# Patient Record
Sex: Male | Born: 1960 | Race: White | Hispanic: No | Marital: Married | Smoking: Current every day smoker
Health system: Southern US, Community
[De-identification: ages and names within clinical notes are randomized; demographics above are authoritative.]

## PROBLEM LIST (undated history)

## (undated) DIAGNOSIS — M1990 Unspecified osteoarthritis, unspecified site: Secondary | ICD-10-CM

## (undated) DIAGNOSIS — E7800 Pure hypercholesterolemia, unspecified: Secondary | ICD-10-CM

## (undated) DIAGNOSIS — M109 Gout, unspecified: Secondary | ICD-10-CM

## (undated) HISTORY — PX: ADENOIDECTOMY: SUR15

## (undated) HISTORY — PX: FOOT FRACTURE SURGERY: SHX645

## (undated) HISTORY — PX: WRIST FRACTURE SURGERY: SHX121

## (undated) HISTORY — PX: TONSILLECTOMY: SUR1361

## (undated) HISTORY — PX: KNEE CARTILAGE SURGERY: SHX688

---

## 2005-08-07 ENCOUNTER — Emergency Department: Payer: Self-pay | Admitting: Emergency Medicine

## 2005-08-11 ENCOUNTER — Ambulatory Visit: Payer: Self-pay | Admitting: Orthopaedic Surgery

## 2021-08-17 ENCOUNTER — Encounter (HOSPITAL_COMMUNITY): Payer: Self-pay | Admitting: Emergency Medicine

## 2021-08-17 ENCOUNTER — Emergency Department (HOSPITAL_COMMUNITY)
Admission: EM | Admit: 2021-08-17 | Discharge: 2021-08-17 | Disposition: A | Discharge status: Home / Self Care | Payer: BC Managed Care – PPO | Attending: Emergency Medicine | Admitting: Emergency Medicine

## 2021-08-17 ENCOUNTER — Emergency Department (HOSPITAL_COMMUNITY): Payer: BC Managed Care – PPO

## 2021-08-17 ENCOUNTER — Other Ambulatory Visit: Payer: Self-pay

## 2021-08-17 DIAGNOSIS — W11XXXA Fall on and from ladder, initial encounter: Secondary | ICD-10-CM | POA: Insufficient documentation

## 2021-08-17 DIAGNOSIS — S82141A Displaced bicondylar fracture of right tibia, initial encounter for closed fracture: Secondary | ICD-10-CM | POA: Insufficient documentation

## 2021-08-17 DIAGNOSIS — S8991XA Unspecified injury of right lower leg, initial encounter: Secondary | ICD-10-CM | POA: Diagnosis present

## 2021-08-17 HISTORY — DX: Pure hypercholesterolemia, unspecified: E78.00

## 2021-08-17 HISTORY — DX: Gout, unspecified: M10.9

## 2021-08-17 HISTORY — DX: Unspecified osteoarthritis, unspecified site: M19.90

## 2021-08-17 MED ORDER — KETOROLAC TROMETHAMINE 15 MG/ML IJ SOLN
15.0000 mg | Freq: Once | INTRAMUSCULAR | Status: AC
  Administered 2021-08-17: 15 mg via INTRAMUSCULAR
  Filled 2021-08-17: qty 1

## 2021-08-17 MED ORDER — OXYCODONE-ACETAMINOPHEN 5-325 MG PO TABS: 1.0000 | ORAL_TABLET | Freq: Four times a day (QID) | ORAL | 0 refills | Status: AC | PRN

## 2021-08-17 MED ORDER — METHOCARBAMOL 500 MG PO TABS
500.0000 mg | ORAL_TABLET | Freq: Once | ORAL | Status: AC
  Administered 2021-08-17: 500 mg via ORAL
  Filled 2021-08-17: qty 1

## 2021-08-17 MED ORDER — MORPHINE SULFATE 15 MG PO TABS: 7.5000 mg | ORAL_TABLET | ORAL | 0 refills | Status: AC | PRN

## 2021-08-17 MED ORDER — METHOCARBAMOL 500 MG PO TABS: 500.0000 mg | ORAL_TABLET | Freq: Two times a day (BID) | ORAL | 0 refills | Status: AC

## 2021-08-17 MED ORDER — ONDANSETRON HCL 4 MG PO TABS: 4.0000 mg | ORAL_TABLET | Freq: Four times a day (QID) | ORAL | 0 refills | Status: AC | PRN

## 2021-08-17 MED ORDER — OXYCODONE-ACETAMINOPHEN 5-325 MG PO TABS
1.0000 | ORAL_TABLET | Freq: Once | ORAL | Status: AC
  Administered 2021-08-17: 1 via ORAL
  Filled 2021-08-17: qty 1

## 2021-08-17 MED ORDER — ONDANSETRON HCL 4 MG PO TABS: 4.0000 mg | ORAL_TABLET | Freq: Four times a day (QID) | ORAL | 0 refills | Status: AC

## 2021-08-17 MED ORDER — HYDROMORPHONE HCL 1 MG/ML IJ SOLN
1.0000 mg | Freq: Once | INTRAMUSCULAR | Status: AC
  Administered 2021-08-17: 1 mg via INTRAMUSCULAR
  Filled 2021-08-17: qty 1

## 2021-08-17 NOTE — ED Notes (Signed)
Patient returned from X-ray 

## 2021-08-17 NOTE — ED Triage Notes (Signed)
Patient brought in via EMS from home. Alert and oriented. Airway patent. Patient c/o bilateral knee pain and swelling. Patient 3 foot up on ladder yesterday and fell, landing on both feet. Pain in knees started shortly after fall and progressively getting worse. Rt knee worse with limited ROM. Patient took Aleve yesterday with no relief.

## 2021-08-17 NOTE — ED Notes (Signed)
EDP at [facility] discussing dx, plan, meds, d/c, tx, and f/u. Family at [facility] x2

## 2021-08-17 NOTE — ED Notes (Signed)
Pt poorly tolerated knee immobilizer(s) and crutches. EDPA at [facility].

## 2021-08-17 NOTE — ED Notes (Signed)
Patient transported to X-ray 

## 2021-08-17 NOTE — ED Notes (Signed)
Pt alert, NAD, calm, interactive, EDPA into room, at [facility].

## 2021-08-17 NOTE — ED Provider Notes (Signed)
[facility]ERGENCY DEPARTMENT Provider Note   CSN: [id number] Arrival date & time: 08/17/21  1008     History  Chief Complaint  Patient presents with   Knee Pain    Charles Perry is a 61 y.o. male.  Charles FoyFrank Becknell is a 61 y.o. male with a history of hyperlipidemia, arthritis, and prior left knee surgeries, who presents to the emergency department via EMS for evaluation of bilateral knee pain after a fall.  Patient reports that he was about 3 feet up on a ladder yesterday when he fell landing on both feet.  He reports shortly after this fall he started having progressively worsening pain in both of his knees with some swelling but he reports that the pain is much worse on right than left.  Reports he feels like the right leg is spasming intermittently causing his right knee to bend and it is extremely painful.  He has not been able to put weight on the knee since then.  Also complains of some pain at the left hip.  Did not hit his head, denies neck or back pain.  No pain in the chest or abdomen.  Took Aleve for his knee pain yesterday without relief.  Reports remote history of multiple surgeries to the left knee and he has had chronic issues with this knee ever since.   The history is provided by the patient, the spouse and a relative.  Knee Pain Associated symptoms: no back pain, no fever and no neck pain       Home Medications Prior to Admission medications   Medication Sig Start Date End Date Taking? Authorizing Provider  allopurinol (ZYLOPRIM) 100 MG tablet Take 100 mg by mouth daily. 07/06/21  Yes [provider]  atorvastatin (LIPITOR) 20 MG tablet Take 20 mg by mouth daily. 07/14/21  Yes [provider]  methocarbamol (ROBAXIN) 500 MG tablet Take 1 tablet (500 mg total) by mouth 2 (two) times daily. 08/17/21  Yes Dartha LodgeFord, Shiven Junious N, PA-C  morphine (MSIR) 15 MG tablet Take 0.5-1 tablets (7.5-15 mg total) by mouth every 4 (four) hours as needed for up to 3 days for severe  pain. 08/17/21 08/20/21 Yes Dartha LodgeFord, Andriel Omalley N, PA-C  ondansetron (ZOFRAN) 4 MG tablet Take 1 tablet (4 mg total) by mouth every 6 (six) hours. 08/17/21  Yes Dartha LodgeFord, Ammon Muscatello N, PA-C  ondansetron (ZOFRAN) 4 MG tablet Take 1 tablet (4 mg total) by mouth every 6 (six) hours as needed for nausea or vomiting. 08/17/21  Yes Dartha LodgeFord, Jerrol Helmers N, PA-C  oxyCODONE-acetaminophen (PERCOCET/ROXICET) 5-325 MG tablet Take 1-2 tablets by mouth every 6 (six) hours as needed for severe pain. 08/17/21  Yes Dartha LodgeFord, Latosha Gaylord N, PA-C      Allergies    Sulfa antibiotics    Review of Systems   Review of Systems  Constitutional:  Negative for chills and fever.  HENT: Negative.    Respiratory:  Negative for cough and shortness of breath.   Cardiovascular:  Negative for chest pain.  Gastrointestinal:  Negative for abdominal pain.  Genitourinary:  Negative for dysuria.  Musculoskeletal:  Positive for arthralgias and joint swelling. Negative for back pain and neck pain.  Neurological:  Negative for dizziness, weakness, light-headedness and numbness.  All other systems reviewed and are negative.  Physical Exam Updated Vital Signs BP (!) 127/96   Pulse 82   Temp 98.3 F (36.8 C) (Oral)   Resp 16   Ht 5\' 10"  (1.778 m)   Wt 83.9 kg  SpO2 96%   BMI 26.54 kg/m  Physical Exam Vitals and nursing note reviewed.  Constitutional:      General: He is not in acute distress.    Appearance: Normal appearance. He is well-developed. He is not diaphoretic.  HENT:     Head: Normocephalic and atraumatic.     Comments: No hematoma, step-off or deformity, no signs of head trauma Eyes:     General:        Right eye: No discharge.        Left eye: No discharge.     Pupils: Pupils are equal, round, and reactive to light.  Neck:     Comments: No midline C-spine tenderness Cardiovascular:     Rate and Rhythm: Normal rate and regular rhythm.     Pulses: Normal pulses.     Heart sounds: Normal heart sounds.  Pulmonary:     Effort: Pulmonary  effort is normal. No respiratory distress.     Breath sounds: Normal breath sounds. No wheezing or rales.     Comments: Respirations equal and unlabored, patient able to speak in full sentences, lungs clear to auscultation bilaterally  Chest:     Chest wall: No tenderness.  Abdominal:     General: Bowel sounds are normal. There is no distension.     Palpations: Abdomen is soft. There is no mass.     Tenderness: There is no abdominal tenderness. There is no guarding.     Comments: Abdomen soft, nondistended, nontender to palpation in all quadrants without guarding or peritoneal signs  Musculoskeletal:        General: Swelling, tenderness and deformity present.     Cervical back: Neck supple.     Comments: Noticed swelling present over both knees, left knee without significant tenderness but more pain with movement.  The right knee is significantly tender to palpation throughout and patient has severe pain with any movement and significantly decreased range of motion. No tenderness throughout the lower leg bilaterally, no tenderness at the foot or ankle.  There is some pain over the left posterior hip without rotation, shortening or deformity. All other joints supple and easily movable, all compartments soft  Skin:    General: Skin is warm and dry.     Capillary Refill: Capillary refill takes less than 2 seconds.  Neurological:     Mental Status: He is alert and oriented to person, place, and time.     Coordination: Coordination normal.     Comments: Speech is clear, able to follow commands CN III-XII intact Normal strength in upper and lower extremities bilaterally including dorsiflexion and plantar flexion, strong and equal grip strength Sensation normal to light and sharp touch Moves extremities without ataxia, coordination intact  Psychiatric:        Mood and Affect: Mood normal.        Behavior: Behavior normal.    ED Results / Procedures / Treatments   Labs (all labs ordered are  listed, but only abnormal results are displayed) Labs Reviewed - No data to display  EKG None  Radiology CT Knee Right Wo Contrast  Result Date: 08/17/2021 CLINICAL DATA:  Patient fell off step ladder today. Fracture of the proximal tibia. Nondisplaced and non comminuted proximal tibial fracture seen on radiograph. EXAM: CT OF THE RIGHT KNEE WITHOUT CONTRAST TECHNIQUE: Multidetector CT imaging of the right knee was performed according to the standard protocol. Multiplanar CT image reconstructions were also generated. RADIATION DOSE REDUCTION: This exam was performed according  to the departmental dose-optimization program which includes automated exposure control, adjustment of the mA and/or kV according to patient size and/or use of iterative reconstruction technique. COMPARISON:  Right knee radiographs 08/17/2021 FINDINGS: Bones/Joint/Cartilage There is an acute comminuted fracture involving the proximal lateral greater than medial aspects of the tibia. On sagittal images, an oblique nondisplaced fracture line extends from the posterosuperior medial tibial plateau through the posterosuperior lateral tibial plateau (sagittal series 6 images 38 through 104). There is a comminuted fracture of the lateral tibial plateau that contacts the superior articular surface in multiple regions (coronal series 5, images 74 through 114). There is up to approximately 5 mm depression of the posterolateral tibial plateau (coronal series 5, image 113) and up to approximately 4 mm cortical depression of the more focal aspect of the anteromedial aspect of the lateral tibial plateau (coronal series 5, image 75). A nondisplaced vertically oriented fracture line also extends through the a anterior medial proximal tibial metaphyseal cortex (coronal series 5, image 76). There also nondisplaced fracture lines extending through the medial and lateral tibial spines. No acute fracture is seen within the distal femur or proximal fibula.  Ligaments Suboptimally assessed by CT. Muscles and Tendons Normal size and density of the regional musculature. No gross large tendon tear is seen. Soft tissues Moderate to high-grade knee joint effusion with layering higher density blood products and nondependent fat (lipohemarthrosis). Mild-to-moderate predominantly anterior knee subcutaneous fat edema and swelling. Additional edema of the posterolateral knee soft tissues. IMPRESSION: 1. Acute, nondisplaced fracture of the proximal lateral greater than proximal medial aspects of the tibia. This extends to the superior articular surface of the lateral greater than medial tibial plateaus. There is up to 5 mm cortical depression of the posterior aspect of the lateral tibial plateau. More focal up to 4 mm cortical depression of a portion of the anteromedial aspect of the lateral tibial plateau. 2. Moderate to high-grade lipohemarthrosis. Electronically Signed   By: Neita Garnet M.D.   On: 08/17/2021 13:32   DG Knee Complete 4 Views Left  Result Date: 08/17/2021 CLINICAL DATA:  Patient fell proximally 3 steps from a ladder 1 day ago. Patient fell the right knee twist. Now unable to bend or bear weight. EXAM: LEFT KNEE - COMPLETE 4+ VIEW COMPARISON:  None Available. FINDINGS: No fracture.  No bone lesion. Moderate narrowing of the lateral compartment with mild medial compartment narrowing. Marginal osteophytes noted from all 3 compartments. Moderate joint effusion distends the suprapatellar joint capsule. Skeletal structures are demineralized. IMPRESSION: 1. No fracture or dislocation. 2. Moderate osteoarthritis, most severe involving the lateral compartment. 3. Moderate joint effusion. Electronically Signed   By: Amie Portland M.D.   On: 08/17/2021 12:00   DG Knee Complete 4 Views Right  Result Date: 08/17/2021 CLINICAL DATA:  Fall from step ladder 1 day ago. Twisting injury to the right knee. Unable to bend or bear weight. EXAM: RIGHT KNEE - COMPLETE 4+ VIEW  COMPARISON:  None Available. FINDINGS: Subtle fracture of the proximal tibia noted on the lateral and 1 oblique view, intersecting the tibial spine on the lateral view. No other evidence of a fracture. Knee joint normally spaced and aligned. Moderate joint effusion with evidence of a fat fluid level. IMPRESSION: 1. Subtle poorly defined fracture of the proximal tibia, nondisplaced and non comminuted. Associated knee joint effusion. Electronically Signed   By: Amie Portland M.D.   On: 08/17/2021 12:03   DG Hip Unilat W or Wo Pelvis 2-3 Views Left  Result Date: 08/17/2021 CLINICAL DATA:  Larey Seat from a stepladder, proximally 3 feet, 1 day ago. Left hip pain. EXAM: DG HIP (WITH OR WITHOUT PELVIS) 2-3V LEFT COMPARISON:  None Available. FINDINGS: There is no evidence of hip fracture or dislocation. There is no evidence of arthropathy or other focal bone abnormality. IMPRESSION: Negative. Electronically Signed   By: Amie Portland M.D.   On: 08/17/2021 12:04    Procedures Procedures    Medications Ordered in ED Medications  oxyCODONE-acetaminophen (PERCOCET/ROXICET) 5-325 MG per tablet 1 tablet (1 tablet Oral Given 08/17/21 1104)  HYDROmorphone (DILAUDID) injection 1 mg (1 mg Intramuscular Given 08/17/21 1243)  ketorolac (TORADOL) 15 MG/ML injection 15 mg (15 mg Intramuscular Given 08/17/21 1513)  methocarbamol (ROBAXIN) tablet 500 mg (500 mg Oral Given 08/17/21 1513)  oxyCODONE-acetaminophen (PERCOCET/ROXICET) 5-325 MG per tablet 1 tablet (1 tablet Oral Given 08/17/21 1513)    ED Course/ Medical Decision Making/ A&P                           Medical Decision Making Amount and/or Complexity of Data Reviewed Radiology: ordered.  Risk Prescription drug management.   This patient presents to the ED for concern of fall, bilateral knee pain, this involves an extensive number of treatment options, and is a complaint that carries with it a high risk of complications and morbidity.  The differential diagnosis  includes fracture, internal derangement, arthritis, traumatic effusion     Additional history obtained:  Additional history obtained from wife and son at bedside External records from outside source obtained and reviewed, unable to review records from prior knee surgeries   Imaging Studies ordered:  I ordered imaging studies including Bilateral knee x-rays, left hip x-ray, CT right knee  I independently visualized and interpreted imaging which showed left hip and knee without fracture, left knee with moderate osteoarthritis, right knee with poorly define fracture of proximal tibia, proceeded with CT for better characterization, which show a tibial plateau fracture, primarily involving the lateral plateau with some depression but no displacement. I agree with the radiologist interpretation    Consultations Obtained:  I requested consultation with the orthopedist, Dr. Linna Caprice,  and discussed lab and imaging findings as well as pertinent plan - they recommend: Knee immobilizer and crutches and for patient to remain nonweightbearing on the right knee, outpatient follow-up with Dr. Carola Frost or Dr. Jena Gauss with orthopedic trauma within the next week. I have sent a chart message to Dr. Carola Frost and Dr. Jena Gauss regarding patient   Problem List / ED Course / Critical interventions / Medication management  Pt with knee injuries after 3 ft fall from ladder. Hx of chronic left knee issues, no acute fx  in the left knee or hip, but right knee with tibial plateau fracture. No displacement but some depression present. Discussed with ortho, recommend immobilization and non-weightbearing, outpatient follow up I ordered medication including Oral and IM narcotics for pain management, as well as Toradol and robaxin for pain and spasm  Reevaluation of the patient after these medicines showed that the patient improved I have reviewed the patients home medicines and have made adjustments as needed Patient placed  in knee immobilizer and crutches, pt had difficulty with crutches, but family will be able to help pt, and he has been given prescription for wheelchair and bedside commode.    Test / Admission - Considered:  Admission was considered, but given imaging and discussion with orthopedics patient recommended for discharge home  with outpatient follow-up with orthopedics.  Patient was provided prescriptions for wheelchair and bedside commode, and given crutches and knee immobilizer and instructed to remain nonweightbearing on the right knee. Discussed these recommendations at length with patient and family at bedside who are in agreement.         Final Clinical Impression(s) / ED Diagnoses Final diagnoses:  Closed fracture of right tibial plateau, initial encounter    Rx / DC Orders ED Discharge Orders          Ordered    Wheelchair        08/17/21 1523    DME Bedside commode        08/17/21 1524    morphine (MSIR) 15 MG tablet  Every 4 hours PRN        08/17/21 1601    ondansetron (ZOFRAN) 4 MG tablet  Every 6 hours        08/17/21 1601    methocarbamol (ROBAXIN) 500 MG tablet  2 times daily        08/17/21 1601    oxyCODONE-acetaminophen (PERCOCET/ROXICET) 5-325 MG tablet  Every 6 hours PRN        08/17/21 1602    ondansetron (ZOFRAN) 4 MG tablet  Every 6 hours PRN        08/17/21 1602              Dartha Lodge, PA-C 08/18/21 3212    Eber Hong, MD 08/25/21 [phone]

## 2021-08-17 NOTE — ED Notes (Signed)
To CT

## 2021-08-17 NOTE — Discharge Instructions (Addendum)
You have a tibial plateau fracture on the right knee. You will need to remain in knee immobilizer, do not put weight on your right leg.   You have been given prescriptions for a wheelchair and bedside commode this can be picked up at the medical supply store tomorrow.  You can use crutches to help stand and pivot.  You will need to call tomorrow morning to schedule close follow-up appointment with Charles Perry. Charles Perry, who are orthopedic trauma specialist.  To help manage pain you can use the oxycodone provided to you at discharge today 1 to 2 tablets as needed every 6 hours for pain and can use Zofran as needed for nausea.  I have sent in a prescription to your pharmacy that you will be able to pick up tomorrow morning for morphine start with half a tablet every 4 hours if still having severe pain you can increase to 1 tablet every 4 hours for pain control.  You can use prescribed Zofran as needed for nausea or vomiting.  If the morphine tends to make you nauseated please take at least 15 to 30 minutes prior to dose of morphine.  You can use prescribed Robaxin as needed for muscle spasm.

## 2021-08-17 NOTE — ED Notes (Signed)
EDPA into see, at BS.   

## 2021-09-25 ENCOUNTER — Other Ambulatory Visit: Payer: Self-pay | Admitting: Orthopedic Surgery

## 2021-09-25 DIAGNOSIS — S83231A Complex tear of medial meniscus, current injury, right knee, initial encounter: Secondary | ICD-10-CM

## 2021-09-28 ENCOUNTER — Ambulatory Visit
Admission: RE | Admit: 2021-09-28 | Discharge: 2021-09-28 | Disposition: A | Discharge status: Home / Self Care | Payer: BC Managed Care – PPO | Source: Ambulatory Visit | Attending: Orthopedic Surgery | Admitting: Orthopedic Surgery

## 2021-09-28 DIAGNOSIS — S83231A Complex tear of medial meniscus, current injury, right knee, initial encounter: Secondary | ICD-10-CM

## 2021-10-03 ENCOUNTER — Other Ambulatory Visit: Payer: BC Managed Care – PPO

## 2023-05-19 IMAGING — DX DG KNEE COMPLETE 4+V*L*
4 series · 4 of 4 positions shown · non-contrast
Comparison: None Available.

CLINICAL DATA: Patient fell proximally 3 steps from a ladder 1 day
ago. Patient fell the right knee twist. Now unable to bend or bear
weight.

EXAM:
LEFT KNEE - COMPLETE 4+ VIEW

[knee ap]
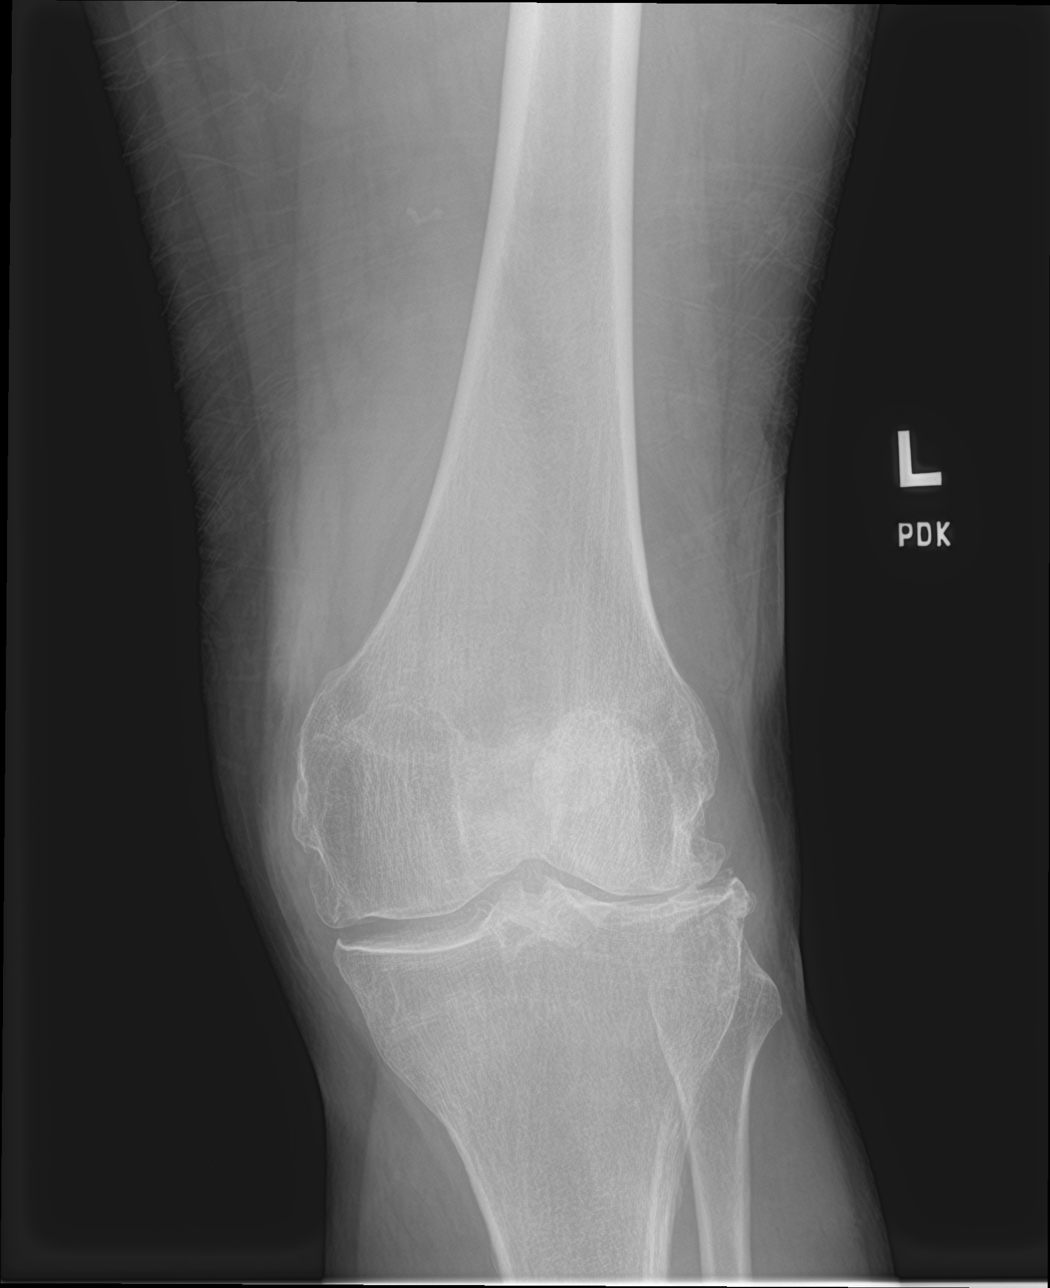

[knee obl (1 of 2)]
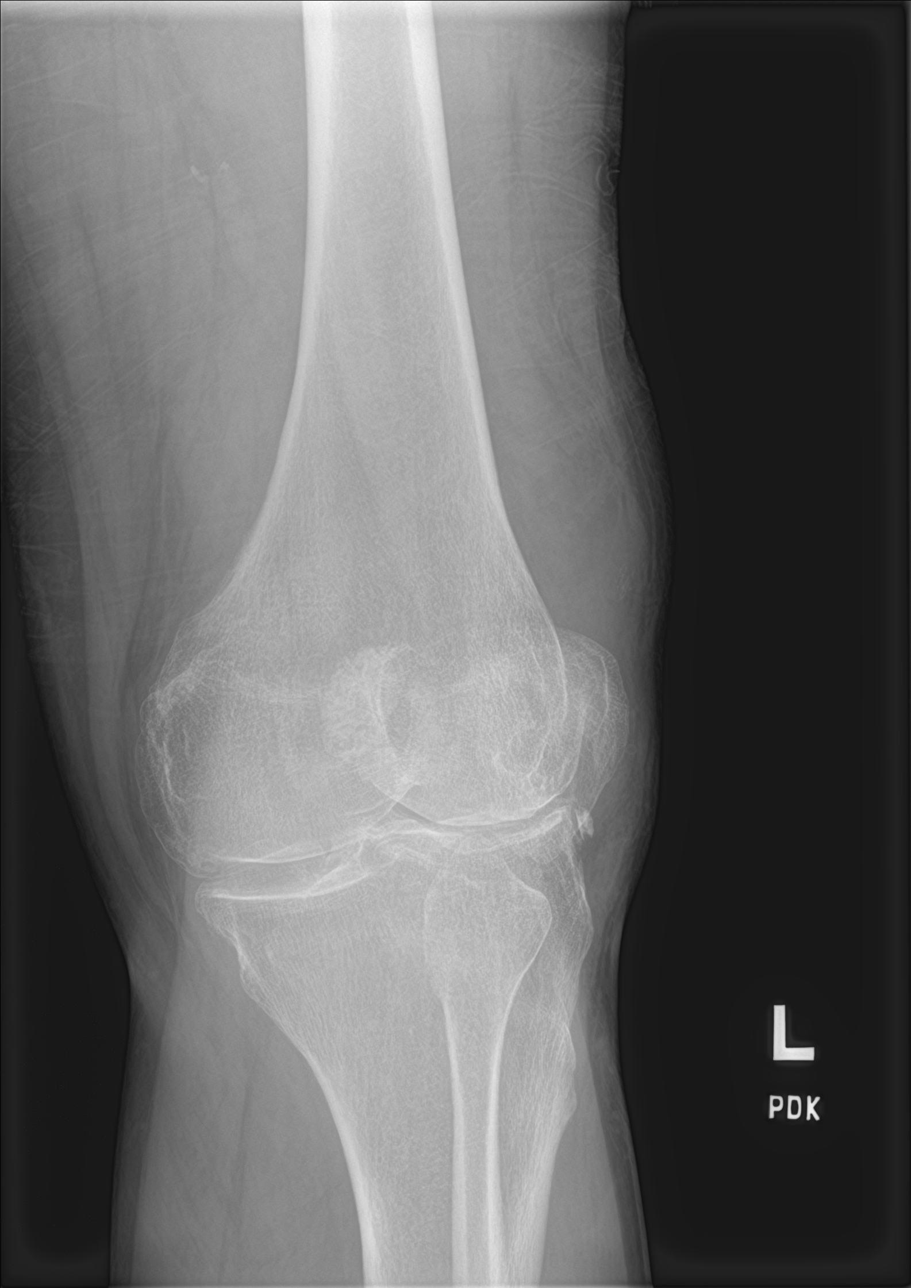

[knee obl (2 of 2)]
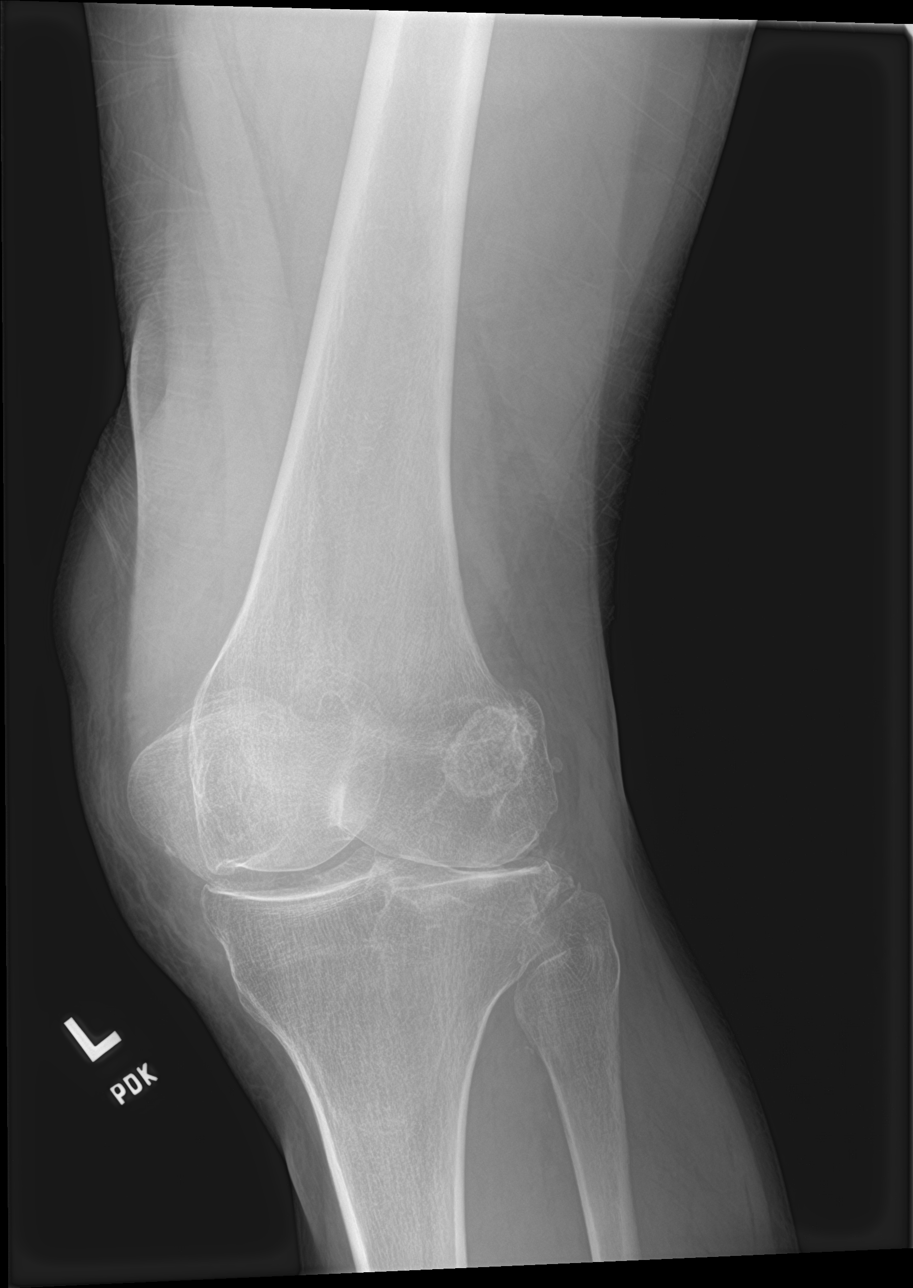

[knee lat]
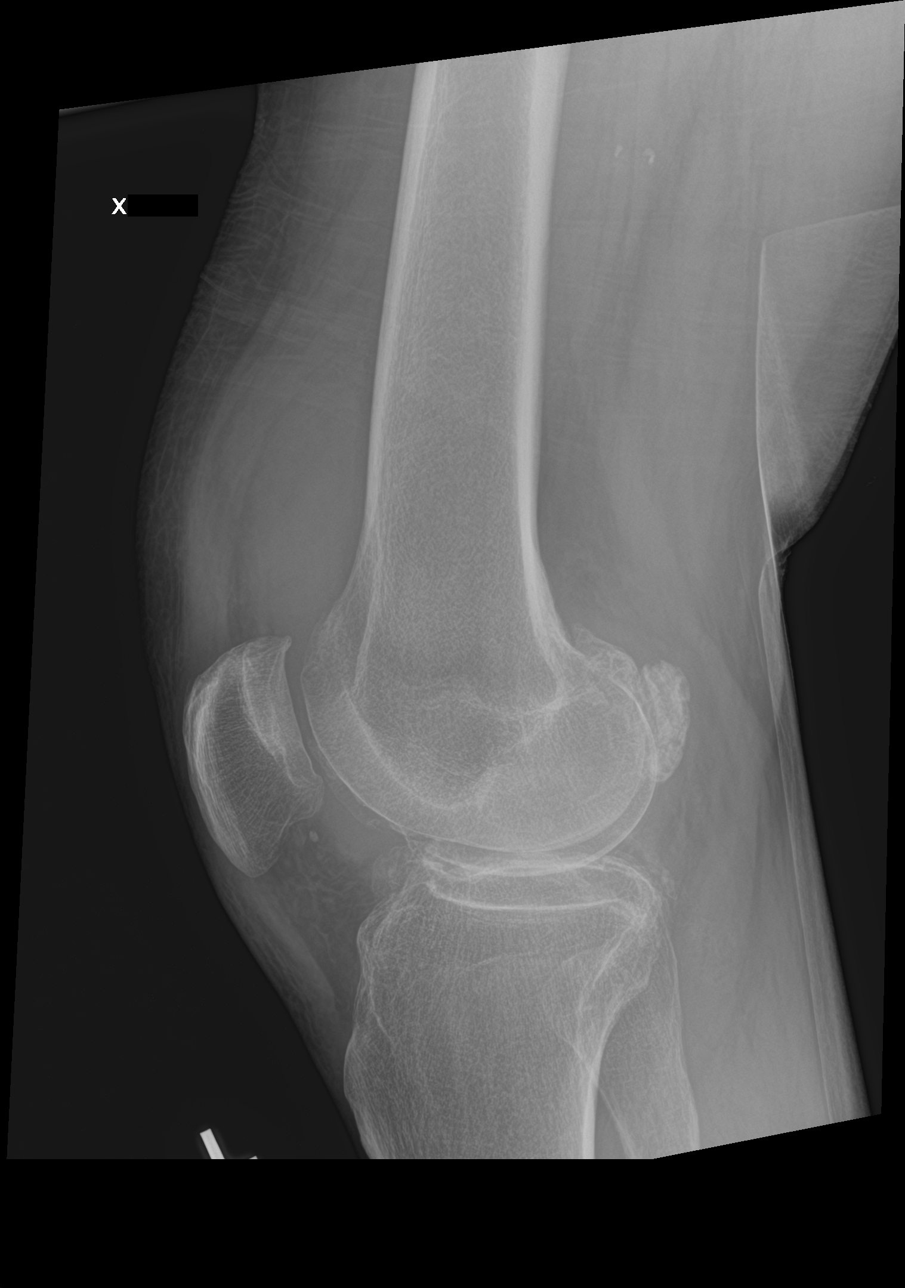

[4 of 4 positions shown; findings below may reference images not displayed]

FINDINGS: No fracture.  No bone lesion.

Moderate narrowing of the lateral compartment with mild medial
compartment narrowing. Marginal osteophytes noted from all 3
compartments.

Moderate joint effusion distends the suprapatellar joint capsule.

Skeletal structures are demineralized.
IMPRESSION: 1. No fracture or dislocation.
2. Moderate osteoarthritis, most severe involving the lateral
compartment.
3. Moderate joint effusion.

## 2023-05-19 IMAGING — CT CT KNEE*R* W/O CM
3 of 7 series · 9 of 33 positions shown, 10 images · non-contrast
Comparison: Right knee radiographs 08/17/2021

CLINICAL DATA: Patient fell off step ladder today. Fracture of the
proximal tibia. Nondisplaced and non comminuted proximal tibial
fracture seen on radiograph.

EXAM:
CT OF THE RIGHT KNEE WITHOUT CONTRAST
TECHNIQUE: Multidetector CT imaging of the right knee was performed according
to the standard protocol. Multiplanar CT image reconstructions were
also generated.
RADIATION DOSE REDUCTION: This exam was performed according to the
departmental dose-optimization program which includes automated
exposure control, adjustment of the mA and/or kV according to
patient size and/or use of iterative reconstruction technique.

[Series 4: axial bone · axial · 0.36mm/px · z∈[+379,+594]mm · 3 of 216 slices shown, 4 images]
[im 1/216  soft-tissue]
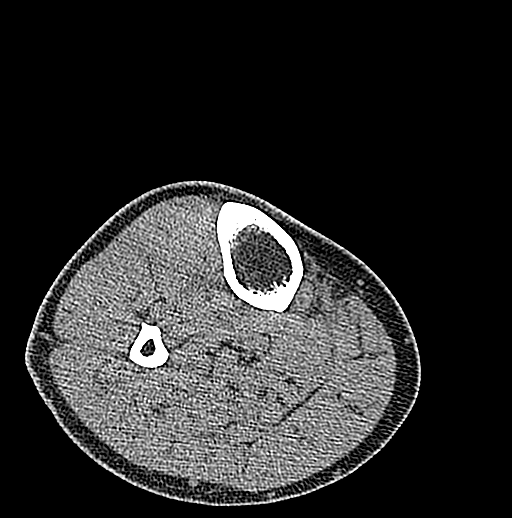
[im 1/216  bone]
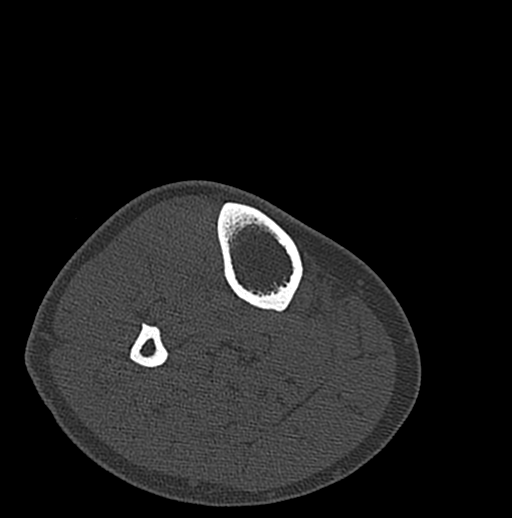
[im 108/216  bone]
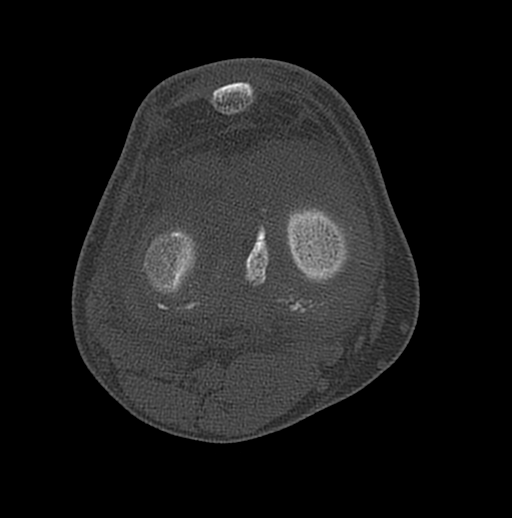
[im 216/216  bone]
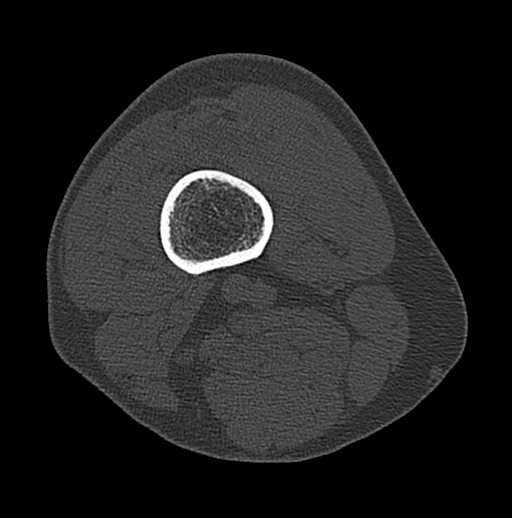

[Series 5: cor bone · coronal · 0.32mm/px · 1 of 193 slices shown]
[im 97/193  bone]
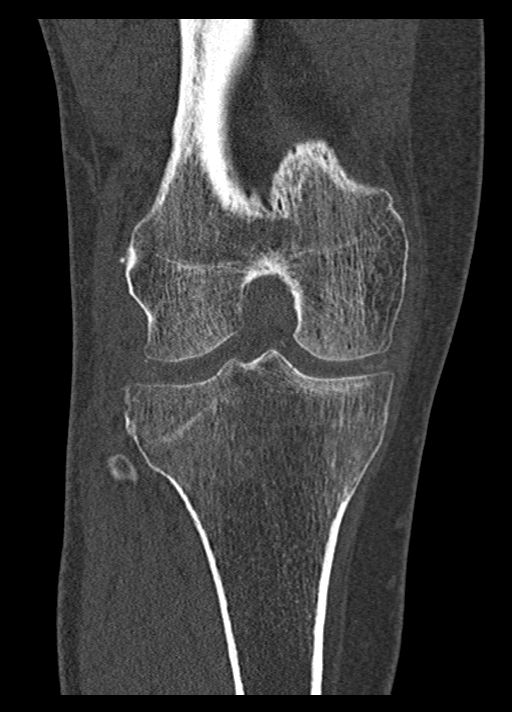

[Series 6: sag bone · sagittal · 0.38mm/px · 5 of 144 slices shown]
[im 24/144  bone]
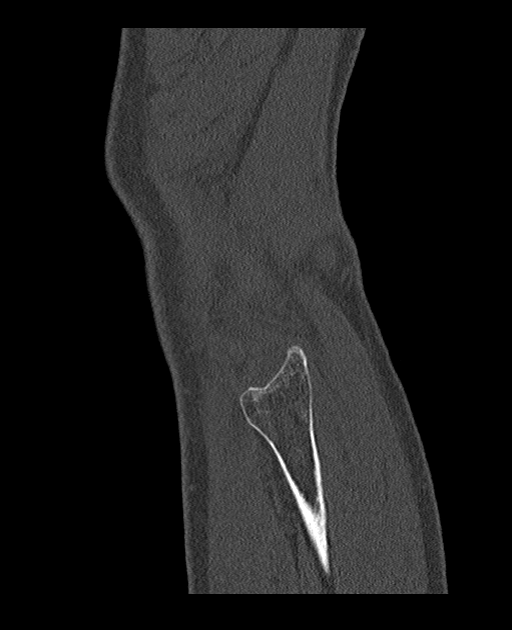
[im 48/144  bone]
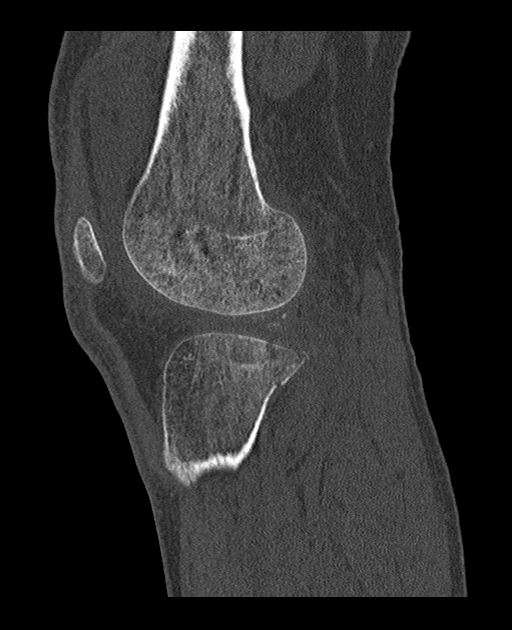
[im 72/144  bone]
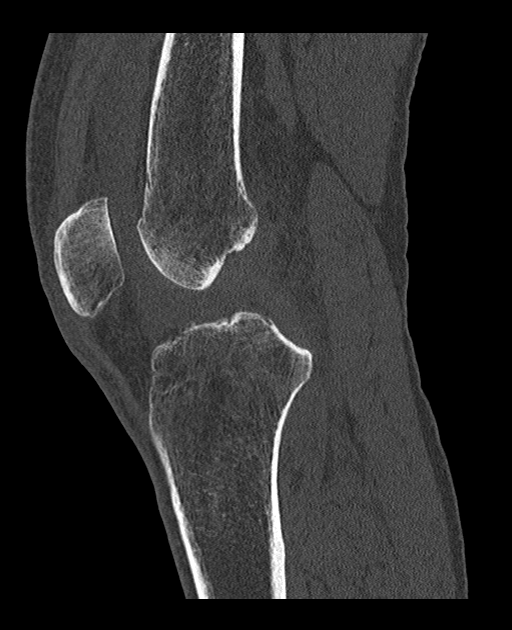
[im 96/144  bone]
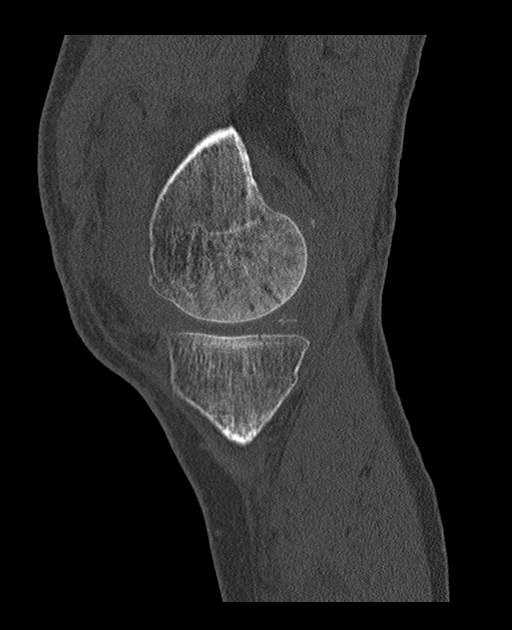
[im 120/144  bone]
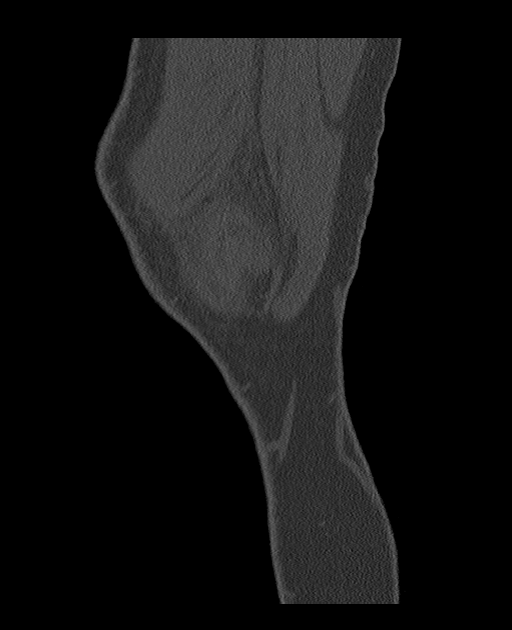

[9 of 33 positions shown; findings below may reference images not displayed]

FINDINGS: Bones/Joint/Cartilage

There is an acute comminuted fracture involving the proximal lateral
greater than medial aspects of the tibia. On sagittal images, an
oblique nondisplaced fracture line extends from the posterosuperior
medial tibial plateau through the posterosuperior lateral tibial
plateau (sagittal series 6 images 38 through 104). There is a
comminuted fracture of the lateral tibial plateau that contacts the
superior articular surface in multiple regions (coronal series 5,
images 74 through 114). There is up to approximately 5 mm depression
of the posterolateral tibial plateau (coronal series 5, image 113)
and up to approximately 4 mm cortical depression of the more focal
aspect of the anteromedial aspect of the lateral tibial plateau
(coronal series 5, image 75).

A nondisplaced vertically oriented fracture line also extends
through the a anterior medial proximal tibial metaphyseal cortex
(coronal series 5, image 76).

There also nondisplaced fracture lines extending through the medial
and lateral tibial spines.

No acute fracture is seen within the distal femur or proximal
fibula.

Ligaments

Suboptimally assessed by CT.

Muscles and Tendons

Normal size and density of the regional musculature. No gross large
tendon tear is seen.

Soft tissues

Moderate to high-grade knee joint effusion with layering higher
density blood products and nondependent fat (lipohemarthrosis).
Mild-to-moderate predominantly anterior knee subcutaneous fat edema
and swelling. Additional edema of the posterolateral knee soft
tissues.
IMPRESSION: 1. Acute, nondisplaced fracture of the proximal lateral greater than
proximal medial aspects of the tibia. This extends to the superior
articular surface of the lateral greater than medial tibial
plateaus. There is up to 5 mm cortical depression of the posterior
aspect of the lateral tibial plateau. More focal up to 4 mm cortical
depression of a portion of the anteromedial aspect of the lateral
tibial plateau.
2. Moderate to high-grade lipohemarthrosis.
